# Patient Record
Sex: Female | Born: 1985 | Race: White | Hispanic: No | Marital: Single | Smoking: Never smoker
Health system: Southern US, Community
[De-identification: ages and names within clinical notes are randomized; demographics above are authoritative.]

## PROBLEM LIST (undated history)

## (undated) HISTORY — PX: CERVICAL BIOPSY  W/ LOOP ELECTRODE EXCISION: SUR135

---

## 2016-09-04 ENCOUNTER — Emergency Department (HOSPITAL_COMMUNITY)
Admission: EM | Admit: 2016-09-04 | Discharge: 2016-09-04 | Disposition: A | Discharge status: Home / Self Care | Payer: Self-pay | Attending: Emergency Medicine | Admitting: Emergency Medicine

## 2016-09-04 ENCOUNTER — Encounter (HOSPITAL_COMMUNITY): Payer: Self-pay | Admitting: Emergency Medicine

## 2016-09-04 DIAGNOSIS — J020 Streptococcal pharyngitis: Secondary | ICD-10-CM | POA: Insufficient documentation

## 2016-09-04 LAB — RAPID STREP SCREEN (MED CTR MEBANE ONLY): STREPTOCOCCUS, GROUP A SCREEN (DIRECT): POSITIVE — AB

## 2016-09-04 MED ORDER — CLINDAMYCIN HCL 150 MG PO CAPS: 150.0000 mg | ORAL_CAPSULE | Freq: Four times a day (QID) | ORAL | 0 refills | Status: DC

## 2016-09-04 MED ORDER — DEXAMETHASONE SODIUM PHOSPHATE 10 MG/ML IJ SOLN
10.0000 mg | Freq: Once | INTRAMUSCULAR | Status: AC
  Administered 2016-09-04: 10 mg via INTRAMUSCULAR
  Filled 2016-09-04: qty 1

## 2016-09-04 NOTE — ED Provider Notes (Signed)
[facility]-EMERGENCY DEPT Provider Note   CSN: [id number] Arrival date & time: 09/04/16  0222  History   Chief Complaint Chief Complaint  Patient presents with  . Sore Throat    HPI Carrie Wells is a 30 y.o. female.  HPI   Patient to the ER with complaints of sore throat that started 3 weeks ago. She has pain with swallowing, she has been taking garlic tablets for her pain. She is actually improving but her pain persists. She has not had fevers, headaches, n/v/d, no weakness or confusion. She is tolerating her secretions.  History reviewed. No pertinent past medical history.  There are no active problems to display for this patient.   History reviewed. No pertinent surgical history.  OB History    No data available       Home Medications    Prior to Admission medications   Medication Sig Start Date End Date Taking? Authorizing Provider  clindamycin (CLEOCIN) 150 MG capsule Take 1 capsule (150 mg total) by mouth every 6 (six) hours. 09/04/16   Marlon Peliffany Manjot Beumer, PA-C    Family History No family history on file.  Social History Social History  Substance Use Topics  . Smoking status: Never Smoker  . Smokeless tobacco: Never Used  . Alcohol use No     Allergies   Patient has no known allergies.   Review of Systems Review of Systems Review of Systems All other systems negative except as documented in the HPI. All pertinent positives and negatives as reviewed in the HPI.   Physical Exam Updated Vital Signs BP 129/70 (BP Location: Right Arm)   Pulse 97   Temp 98.2 F (36.8 C) (Oral)   Resp 16   Ht 5\' 1"  (1.549 m)   Wt 81.6 kg   SpO2 99%   BMI 34.01 kg/m   Physical Exam  Constitutional: She is oriented to person, place, and time. She appears well-developed and well-nourished. No distress.  HENT:  Head: Normocephalic and atraumatic.  Right Ear: Tympanic membrane, external ear and ear canal normal.  Left Ear: Tympanic membrane, external ear and ear  canal normal.  Nose: Nose normal. No rhinorrhea. Right sinus exhibits no maxillary sinus tenderness and no frontal sinus tenderness. Left sinus exhibits no maxillary sinus tenderness and no frontal sinus tenderness.  Mouth/Throat: Uvula is midline and mucous membranes are normal. No trismus in the jaw. Normal dentition. No dental abscesses or uvula swelling. Oropharyngeal exudate and posterior oropharyngeal edema present. No posterior oropharyngeal erythema or tonsillar abscesses.  No submental edema, tongue not elevated, no trismus. No impending airway obstruction; Pt able to speak full sentences, swallow intact, no drooling, stridor, or tonsillar/uvula displacement. No palatal petechia  Eyes: Conjunctivae are normal.  Neck: Trachea normal, normal range of motion and full passive range of motion without pain. Neck supple. No neck rigidity. Normal range of motion present. No Brudzinski's sign noted.  Flexion and extension of neck without pain or difficulty. Able to breath without difficulty in extension.  Cardiovascular: Normal rate and regular rhythm.   Pulmonary/Chest: Effort normal and breath sounds normal. No stridor. No respiratory distress. She has no wheezes.  Abdominal: Soft. There is no tenderness.  No obvious evidence of splenomegaly. Non ttp.   Musculoskeletal: Normal range of motion.  Lymphadenopathy:       Head (right side): No preauricular and no posterior auricular adenopathy present.       Head (left side): No preauricular and no posterior auricular adenopathy present.  She has cervical adenopathy.  Neurological: She is alert and oriented to person, place, and time.  Skin: Skin is warm and dry. No rash noted. She is not diaphoretic.  Psychiatric: She has a normal mood and affect.  Nursing note and vitals reviewed.    ED Treatments / Results  Labs (all labs ordered are listed, but only abnormal results are displayed) Labs Reviewed  RAPID STREP SCREEN (NOT AT [facility]) -  Abnormal; Notable for the following:       Result Value   Streptococcus, Group A Screen (Direct) POSITIVE (*)    All other components within normal limits    EKG  EKG Interpretation None       Radiology No results found.  Procedures Procedures (including critical care time)  Medications Ordered in ED Medications  dexamethasone (DECADRON) injection 10 mg (not administered)     Initial Impression / Assessment and Plan / ED Course  I have reviewed the triage vital signs and the nursing notes.  Pertinent labs & imaging results that were available during my care of the patient were reviewed by me and considered in my medical decision making (see chart for details).  Clinical Course     Pt rapid strep test positive. Pt is tolerating secretions. Presentation not concerning for peritonsillar abscess or spread of infection to deep spaces of the throat; patent airway. Pt will be discharged with clindamycin.  Specific return precautions discussed. Recommended PCP follow up.  Specific return precautions discussed.  Recommended PCP follow up. Pt appears safe for discharge.      Final Clinical Impressions(s) / ED Diagnoses   Final diagnoses:  Strep pharyngitis    New Prescriptions New Prescriptions   CLINDAMYCIN (CLEOCIN) 150 MG CAPSULE    Take 1 capsule (150 mg total) by mouth every 6 (six) hours.     Marlon Peliffany Unika Nazareno, PA-C 09/04/16 0505    Gilda Creasehristopher J Pollina, MD 09/04/16 [phone]

## 2016-09-04 NOTE — ED Triage Notes (Signed)
Pt. reports sore throat for 3 weeks , denies fever or chills , respirations unlabored .

## 2017-07-30 ENCOUNTER — Emergency Department (HOSPITAL_COMMUNITY)
Admission: EM | Admit: 2017-07-30 | Discharge: 2017-07-31 | Disposition: A | Discharge status: Home / Self Care | Payer: Self-pay | Attending: Emergency Medicine | Admitting: Emergency Medicine

## 2017-07-30 ENCOUNTER — Emergency Department (HOSPITAL_COMMUNITY): Payer: Self-pay

## 2017-07-30 ENCOUNTER — Other Ambulatory Visit: Payer: Self-pay

## 2017-07-30 ENCOUNTER — Encounter (HOSPITAL_COMMUNITY): Payer: Self-pay | Admitting: Emergency Medicine

## 2017-07-30 DIAGNOSIS — J039 Acute tonsillitis, unspecified: Secondary | ICD-10-CM | POA: Insufficient documentation

## 2017-07-30 DIAGNOSIS — J0101 Acute recurrent maxillary sinusitis: Secondary | ICD-10-CM | POA: Insufficient documentation

## 2017-07-30 DIAGNOSIS — Z79899 Other long term (current) drug therapy: Secondary | ICD-10-CM | POA: Insufficient documentation

## 2017-07-30 LAB — CBC WITH DIFFERENTIAL/PLATELET
Basophils Absolute: 0 10*3/uL (ref 0.0–0.1)
Basophils Relative: 0 %
Eosinophils Absolute: 0.1 10*3/uL (ref 0.0–0.7)
Eosinophils Relative: 1 %
HCT: 41.5 % (ref 36.0–46.0)
Hemoglobin: 14.1 g/dL (ref 12.0–15.0)
Lymphocytes Relative: 24 %
Lymphs Abs: 2.5 10*3/uL (ref 0.7–4.0)
MCH: 32.1 pg (ref 26.0–34.0)
MCHC: 34 g/dL (ref 30.0–36.0)
MCV: 94.5 fL (ref 78.0–100.0)
Monocytes Absolute: 0.6 10*3/uL (ref 0.1–1.0)
Monocytes Relative: 6 %
Neutro Abs: 7.1 10*3/uL (ref 1.7–7.7)
Neutrophils Relative %: 69 %
Platelets: 263 10*3/uL (ref 150–400)
RBC: 4.39 MIL/uL (ref 3.87–5.11)
RDW: 13 % (ref 11.5–15.5)
WBC: 10.2 10*3/uL (ref 4.0–10.5)

## 2017-07-30 LAB — COMPREHENSIVE METABOLIC PANEL
ALT: 15 U/L (ref 14–54)
AST: 19 U/L (ref 15–41)
Albumin: 4.3 g/dL (ref 3.5–5.0)
Alkaline Phosphatase: 85 U/L (ref 38–126)
Anion gap: 9 (ref 5–15)
BILIRUBIN TOTAL: 1.1 mg/dL (ref 0.3–1.2)
BUN: 8 mg/dL (ref 6–20)
CALCIUM: 9.1 mg/dL (ref 8.9–10.3)
CO2: 23 mmol/L (ref 22–32)
CREATININE: 0.83 mg/dL (ref 0.44–1.00)
Chloride: 106 mmol/L (ref 101–111)
GFR calc Af Amer: >60 mL/min (ref >60)
Glucose, Bld: 89 mg/dL (ref 65–99)
Potassium: 3.4 mmol/L — ABNORMAL LOW (ref 3.5–5.1)
Sodium: 138 mmol/L (ref 135–145)
TOTAL PROTEIN: 7.7 g/dL (ref 6.5–8.1)

## 2017-07-30 LAB — I-STAT CG4 LACTIC ACID, ED: Lactic Acid, Venous: 1.16 mmol/L (ref 0.5–1.9)

## 2017-07-30 NOTE — ED Triage Notes (Signed)
Patient arrives with complaint of sore throat, swollen tonsils, shortness of breath, cough, ear pain, body aches, nausea, and malaise. States onset as beginning of November. States it has gotten better at times, but keeps returning. States subjective fevers at home.

## 2017-07-31 MED ORDER — CLINDAMYCIN HCL 300 MG PO CAPS: 300.0000 mg | ORAL_CAPSULE | Freq: Four times a day (QID) | ORAL | 0 refills | Status: DC

## 2017-07-31 MED ORDER — PREDNISONE 10 MG PO TABS: 20.0000 mg | ORAL_TABLET | Freq: Two times a day (BID) | ORAL | 0 refills | Status: AC

## 2017-07-31 NOTE — ED Provider Notes (Signed)
[facility] EMERGENCY [facility] Provider Note   CSN: [id number] Arrival date & time: 07/30/17  2146     History   Chief Complaint Chief Complaint  Patient presents with  . Shortness of Breath  . Sore Throat  . Generalized Body Aches    HPI Carrie Wells is a 31 y.o. female.  Patient is a 31 year old female with no significant past medical history presenting for evaluation of a 3-week history of sore throat, ear pressure, sinus pressure, and dry cough.  She has tried multiple over-the-counter medications, however is not improving.  She denies any ill contacts.  She does report fevers at home.   The history is provided by the patient.  Sore Throat  This is a new problem. Episode onset: 3 weeks ago. The problem occurs constantly. The problem has been rapidly worsening. Associated symptoms include shortness of breath. Pertinent negatives include no chest pain. Nothing aggravates the symptoms. Nothing relieves the symptoms. She has tried acetaminophen for the symptoms. The treatment provided no relief.    History reviewed. No pertinent past medical history.  There are no active problems to display for this patient.   Past Surgical History:  Procedure Laterality Date  . CERVICAL BIOPSY  W/ LOOP ELECTRODE EXCISION    . CESAREAN SECTION      OB History    No data available       Home Medications    Prior to Admission medications   Medication Sig Start Date End Date Taking? Authorizing Provider  clindamycin (CLEOCIN) 150 MG capsule Take 1 capsule (150 mg total) by mouth every 6 (six) hours. 09/04/16   Marlon PelGreene, Tiffany, PA-C    Family History History reviewed. No pertinent family history.  Social History Social History   Tobacco Use  . Smoking status: Never Smoker  . Smokeless tobacco: Never Used  Substance Use Topics  . Alcohol use: No  . Drug use: No     Allergies   Penicillins   Review of Systems Review of Systems    Respiratory: Positive for shortness of breath.   Cardiovascular: Negative for chest pain.  All other systems reviewed and are negative.    Physical Exam Updated Vital Signs BP (!) 144/90   Pulse 73   Temp 98.5 F (36.9 C) (Oral)   Resp 18   Ht 5\' 1"  (1.549 m)   Wt 79.4 kg (175 lb)   LMP  (Within Weeks)   SpO2 99%   BMI 33.07 kg/m   Physical Exam  Constitutional: She is oriented to person, place, and time. She appears well-developed and well-nourished. No distress.  HENT:  Head: Normocephalic and atraumatic.  Mouth/Throat: No oropharyngeal exudate or posterior oropharyngeal edema.  There is mild inflammation of the posterior oropharynx.  TMs are clear bilaterally.  There is tenderness over the maxillary and frontal sinuses.  Neck: Normal range of motion. Neck supple.  Cardiovascular: Normal rate and regular rhythm. Exam reveals no gallop and no friction rub.  No murmur heard. Pulmonary/Chest: Effort normal and breath sounds normal. No respiratory distress. She has no wheezes.  Abdominal: Soft. Bowel sounds are normal. She exhibits no distension. There is no tenderness.  Musculoskeletal: Normal range of motion.  Neurological: She is alert and oriented to person, place, and time.  Skin: Skin is warm and dry. She is not diaphoretic.  Nursing note and vitals reviewed.    ED Treatments / Results  Labs (all labs ordered are listed, but only abnormal results are displayed)  Labs Reviewed  COMPREHENSIVE METABOLIC PANEL - Abnormal; Notable for the following components:      Result Value   Potassium 3.4 (*)    All other components within normal limits  CBC WITH DIFFERENTIAL/PLATELET  I-STAT CG4 LACTIC ACID, ED  I-STAT CG4 LACTIC ACID, ED    EKG  EKG Interpretation None       Radiology Dg Chest 2 View  Result Date: 07/30/2017 CLINICAL DATA:  Sore throat, tonsillar swelling, shortness of breath, cough, body aches, nausea, and bullae since November. Fevers. EXAM:  CHEST  2 VIEW COMPARISON:  None. FINDINGS: The heart size and mediastinal contours are within normal limits. Both lungs are clear. The visualized skeletal structures are unremarkable. IMPRESSION: No active cardiopulmonary disease. Electronically Signed   By: Burman NievesWilliam  Stevens M.D.   On: 07/30/2017 22:36    Procedures Procedures (including critical care time)  Medications Ordered in ED Medications - No data to display   Initial Impression / Assessment and Plan / ED Course  I have reviewed the triage vital signs and the nursing notes.  Pertinent labs & imaging results that were available during my care of the patient were reviewed by me and considered in my medical decision making (see chart for details).  Patient with URI symptoms for greater than 3 weeks.  She has tenderness over her sinuses and reports getting "tonsillitis" at least once per year.  In the past she has been on antibiotics which helps her symptoms.  She will be given amoxicillin, prednisone, and follow-up as needed.  Final Clinical Impressions(s) / ED Diagnoses   Final diagnoses:  None    ED Discharge Orders    None       Geoffery Lyonselo, Mariell Nester, MD 07/31/17 [phone]

## 2017-07-31 NOTE — Discharge Instructions (Signed)
Clindamycin and prednisone as prescribed.  Ibuprofen 600 mg's every 6 hours as needed for pain.  Follow up with your primary doctor if not feeling better in the next 3 days.

## 2018-06-14 ENCOUNTER — Emergency Department (HOSPITAL_COMMUNITY): Payer: Self-pay

## 2018-06-14 ENCOUNTER — Encounter (HOSPITAL_COMMUNITY): Payer: Self-pay

## 2018-06-14 ENCOUNTER — Other Ambulatory Visit: Payer: Self-pay

## 2018-06-14 ENCOUNTER — Emergency Department (HOSPITAL_COMMUNITY)
Admission: EM | Admit: 2018-06-14 | Discharge: 2018-06-14 | Disposition: A | Discharge status: Home / Self Care | Payer: Self-pay | Attending: Emergency Medicine | Admitting: Emergency Medicine

## 2018-06-14 DIAGNOSIS — I4589 Other specified conduction disorders: Secondary | ICD-10-CM | POA: Insufficient documentation

## 2018-06-14 DIAGNOSIS — R9431 Abnormal electrocardiogram [ECG] [EKG]: Secondary | ICD-10-CM

## 2018-06-14 DIAGNOSIS — J069 Acute upper respiratory infection, unspecified: Secondary | ICD-10-CM | POA: Insufficient documentation

## 2018-06-14 DIAGNOSIS — B9789 Other viral agents as the cause of diseases classified elsewhere: Secondary | ICD-10-CM

## 2018-06-14 DIAGNOSIS — R202 Paresthesia of skin: Secondary | ICD-10-CM | POA: Insufficient documentation

## 2018-06-14 DIAGNOSIS — Z79899 Other long term (current) drug therapy: Secondary | ICD-10-CM | POA: Insufficient documentation

## 2018-06-14 NOTE — ED Provider Notes (Signed)
[facility] EMERGENCY DEPARTMENT Provider Note   CSN: [id number] Arrival date & time: 06/14/18  1351   History   Chief Complaint Chief Complaint  Patient presents with  . Cough    HPI Carrie Wells is a 32 y.o. female.  HPI   32 year old female presents today with complaints of upper respiratory infection.  Patient notes 3 days ago she developed fevers, chills, cough and sore throat.  She notes that the symptoms have persisted, she notes taking Mucinex and DayQuil with no improvement in her symptoms.  She notes yesterday at work she had increased respirations, tachycardia, tingling in her fingers.  Patient notes associated chest tightness and shortness of breath.  Patient notes symptoms have improved.  No history DVT or PE, denies any significant risk factors.  Patient reports she vapes, does not smoke cigarettes.   History reviewed. No pertinent past medical history.  There are no active problems to display for this patient.   Past Surgical History:  Procedure Laterality Date  . CERVICAL BIOPSY  W/ LOOP ELECTRODE EXCISION    . CESAREAN SECTION       OB History   None      Home Medications    Prior to Admission medications   Medication Sig Start Date End Date Taking? Authorizing Provider  clindamycin (CLEOCIN) 300 MG capsule Take 1 capsule (300 mg total) by mouth 4 (four) times daily. X 7 days 07/31/17   Geoffery Lyons, MD  predniSONE (DELTASONE) 10 MG tablet Take 2 tablets (20 mg total) by mouth 2 (two) times daily with a meal. 07/31/17   Geoffery Lyons, MD    Family History No family history on file.  Social History Social History   Tobacco Use  . Smoking status: Never Smoker  . Smokeless tobacco: Never Used  Substance Use Topics  . Alcohol use: No  . Drug use: No     Allergies   Penicillins   Review of Systems Review of Systems  All other systems reviewed and are negative.   Physical Exam Updated Vital Signs BP 114/69  (BP Location: Right Arm)   Pulse (!) 55   Temp 98.6 F (37 C) (Oral)   Resp 16   SpO2 99%   Physical Exam  Constitutional: She is oriented to person, place, and time. She appears well-developed and well-nourished.  HENT:  Head: Normocephalic and atraumatic.  Eyes: Pupils are equal, round, and reactive to light. Conjunctivae are normal. Right eye exhibits no discharge. Left eye exhibits no discharge. No scleral icterus.  Neck: Normal range of motion. No JVD present. No tracheal deviation present.  Cardiovascular: Normal rate, regular rhythm, normal heart sounds and intact distal pulses. Exam reveals no gallop and no friction rub.  No murmur heard. Pulmonary/Chest: Effort normal and breath sounds normal. No stridor. No respiratory distress. She has no wheezes. She has no rales. She exhibits no tenderness.  Musculoskeletal: She exhibits no edema.  Neurological: She is alert and oriented to person, place, and time. Coordination normal.  Psychiatric: She has a normal mood and affect. Her behavior is normal. Judgment and thought content normal.  Nursing note and vitals reviewed.    ED Treatments / Results  Labs (all labs ordered are listed, but only abnormal results are displayed) Labs Reviewed  I-STAT CHEM 8, ED    EKG None   Normal sinus rhythm 79 bpm  Vent. rate 79 BPM PR interval 146 ms QRS duration 76 ms QT/QTc 424/486 ms P-R-T axes 71  85 72  Prolonged QT noted  Radiology Dg Chest 2 View  Result Date: 06/14/2018 CLINICAL DATA:  Central and left-sided chest pain with dyspnea x3 days. EXAM: CHEST - 2 VIEW COMPARISON:  07/30/2017 FINDINGS: The heart size and mediastinal contours are within normal limits. Both lungs are clear. The visualized skeletal structures are unremarkable. IMPRESSION: No active cardiopulmonary disease. Electronically Signed   By: Tollie Eth M.D.   On: 06/14/2018 14:33    Procedures Procedures (including critical care time)  Medications Ordered in  ED Medications - No data to display   Initial Impression / Assessment and Plan / ED Course  I have reviewed the triage vital signs and the nursing notes.  Pertinent labs & imaging results that were available during my care of the patient were reviewed by me and considered in my medical decision making (see chart for details).     Labs:   Imaging: ED EKG  Consults:  Therapeutics:  Discharge Meds:   Assessment/Plan: 32 year old female presents today with likely viral URI.  Patient also having paresthesias, none presently, these are likely secondary to anxiety.  I did recommend laboratory analysis, patient would not like any further testing or evaluation at this time.  Patient had an EKG with slightly prolonged QT; patient has not had any EKGs previously, she does not take any medications presently.  I have encouraged her to follow-up with cardiology for repeat evaluation.  She is given strict return precautions, she verbalized understanding and agreement to today's plan.     Final Clinical Impressions(s) / ED Diagnoses   Final diagnoses:  Viral URI with cough  Paresthesia  Prolonged QT interval    ED Discharge Orders    None       Eyvonne Mechanic, PA-C 06/14/18 1658    Doug Sou, MD 06/14/18 2259

## 2018-06-14 NOTE — Discharge Instructions (Addendum)
Please read attached information. If you experience any new or worsening signs or symptoms please return to the emergency room for evaluation. Please follow-up with your primary care provider or specialist as discussed.  °

## 2018-06-14 NOTE — ED Triage Notes (Signed)
Patient complains of cough and congestion since Sunday. States that the pain is worse with inspiration and movement. NAD

## 2018-06-14 NOTE — ED Notes (Signed)
Pt states that she does not want blood work done and that she wants to leave; EDP at bedside

## 2018-06-14 NOTE — ED Notes (Signed)
Patient verbalizes understanding of discharge instructions. Opportunity for questioning and answers were provided. Pt discharged from ED. 

## 2018-06-19 IMAGING — CR DG CHEST 2V
2 series · 2 of 2 positions shown · non-contrast
Comparison: None.

CLINICAL DATA: Sore throat, tonsillar swelling, shortness of
breath, cough, body aches, nausea, and bullae since [REDACTED].
Fevers.

EXAM:
CHEST  2 VIEW

[chest pa]
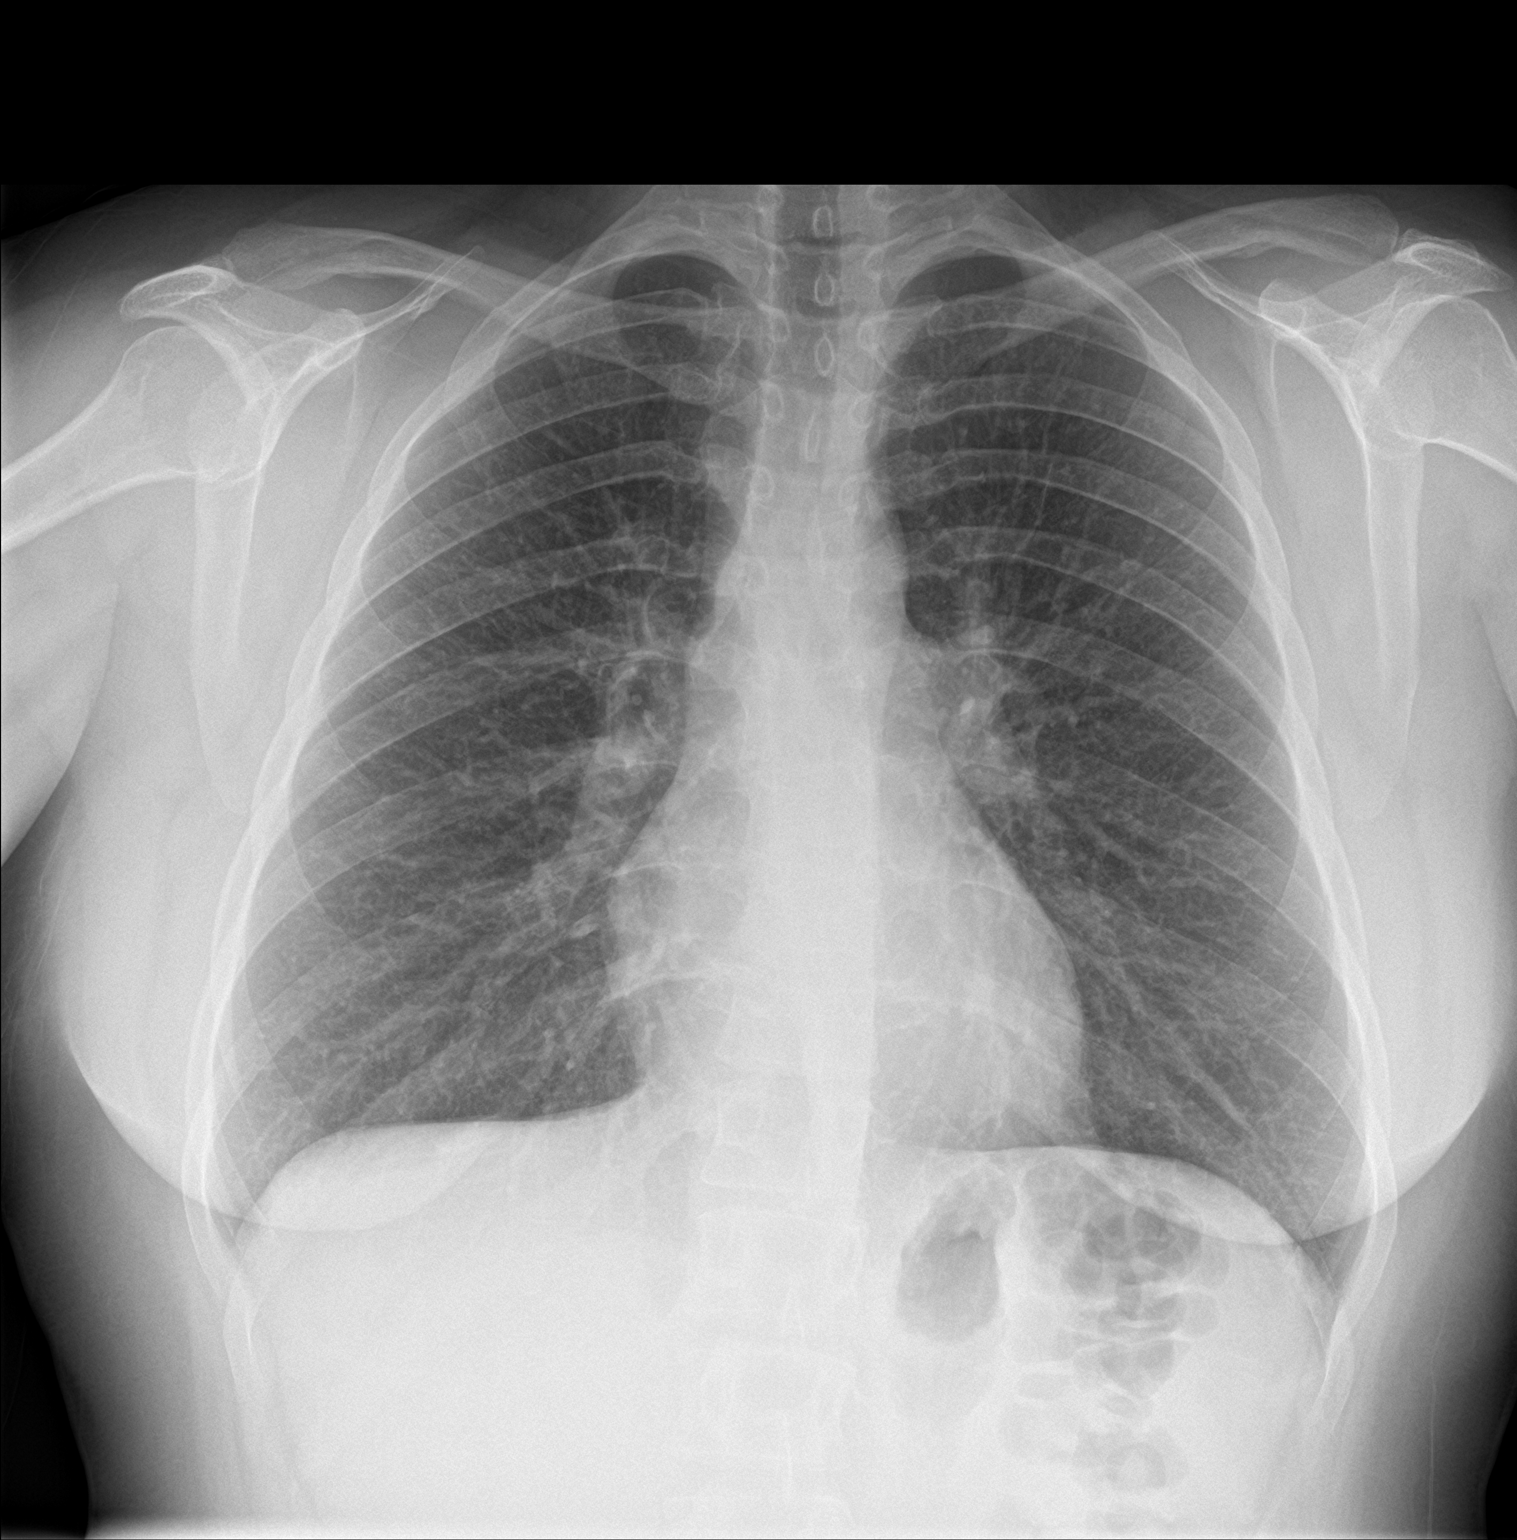

[chest lat]
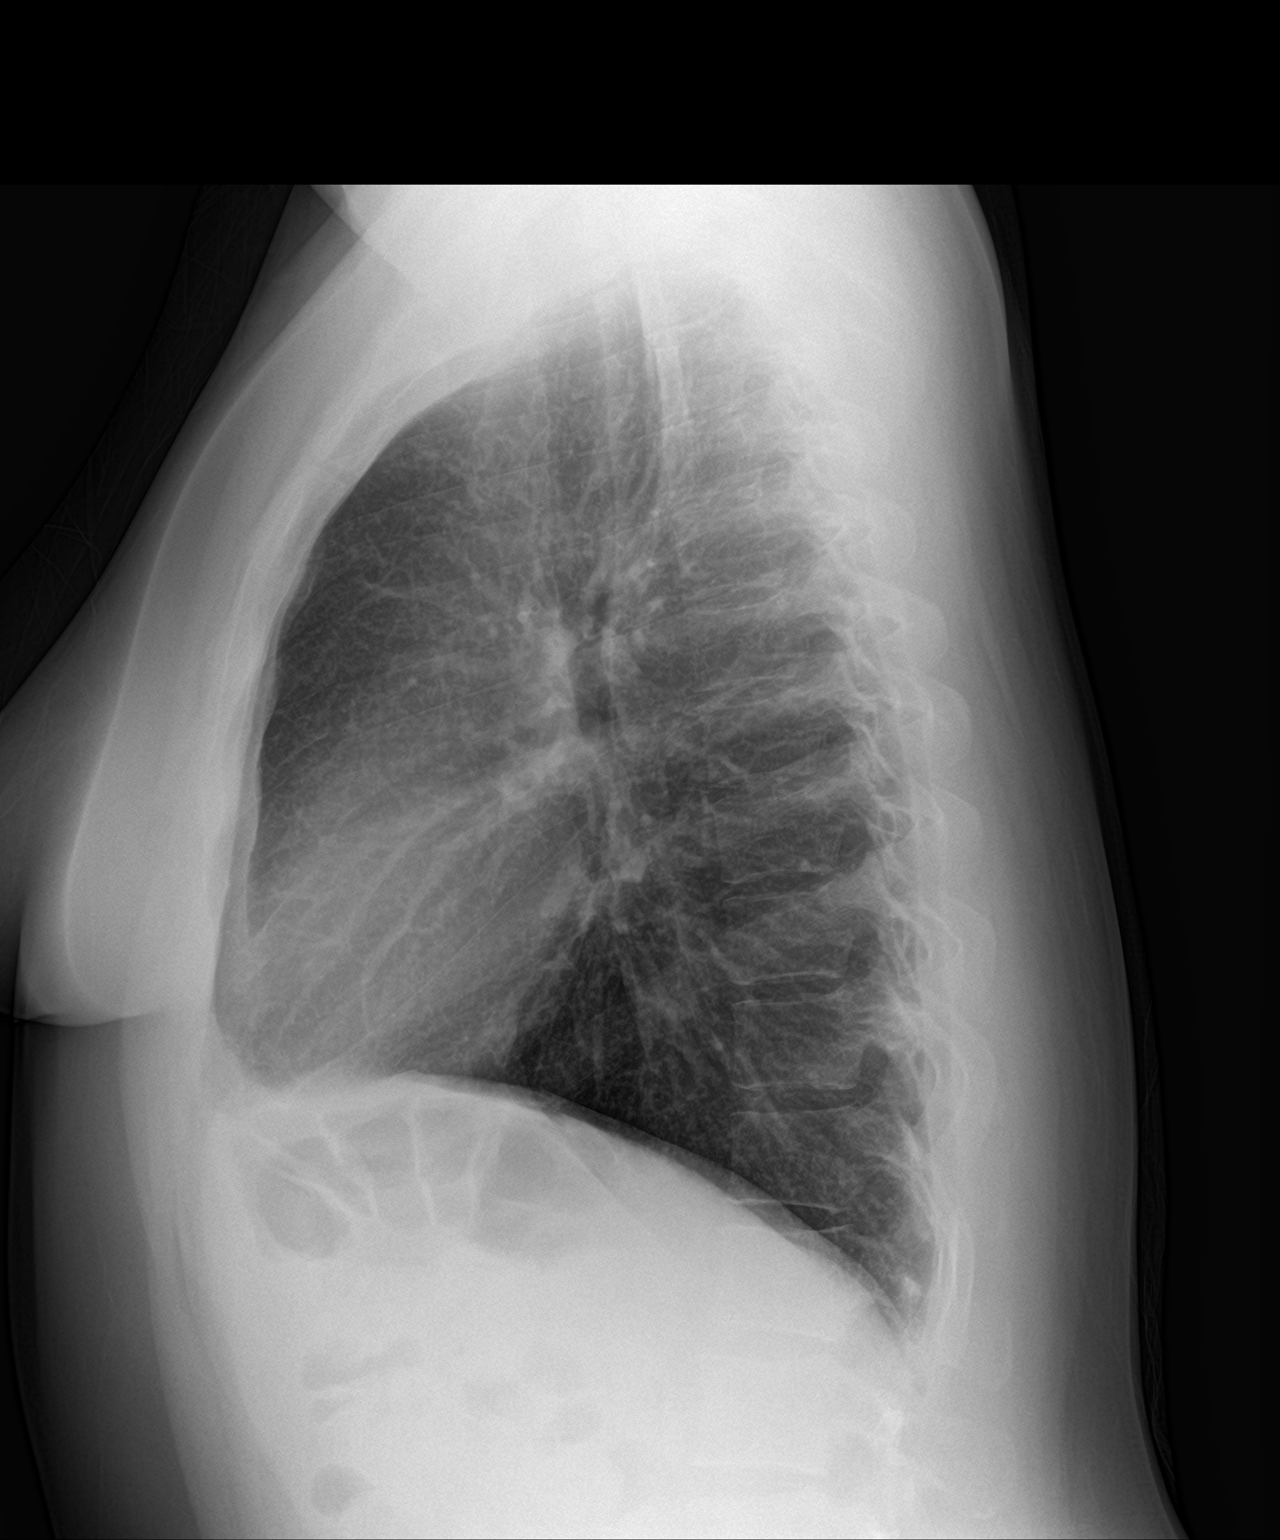

[2 of 2 positions shown; findings below may reference images not displayed]

FINDINGS: The heart size and mediastinal contours are within normal limits.
Both lungs are clear. The visualized skeletal structures are
unremarkable.
IMPRESSION: No active cardiopulmonary disease.

## 2018-07-12 ENCOUNTER — Emergency Department (HOSPITAL_COMMUNITY)
Admission: EM | Admit: 2018-07-12 | Discharge: 2018-07-12 | Disposition: A | Discharge status: Home / Self Care | Payer: Self-pay | Attending: Emergency Medicine | Admitting: Emergency Medicine

## 2018-07-12 ENCOUNTER — Encounter (HOSPITAL_COMMUNITY): Payer: Self-pay | Admitting: Emergency Medicine

## 2018-07-12 ENCOUNTER — Other Ambulatory Visit: Payer: Self-pay

## 2018-07-12 DIAGNOSIS — K029 Dental caries, unspecified: Secondary | ICD-10-CM | POA: Insufficient documentation

## 2018-07-12 MED ORDER — CLINDAMYCIN HCL 150 MG PO CAPS
300.0000 mg | ORAL_CAPSULE | Freq: Once | ORAL | Status: AC
Start: 2018-07-12 — End: 2018-07-12
  Administered 2018-07-12: 300 mg via ORAL
  Filled 2018-07-12: qty 2

## 2018-07-12 MED ORDER — OXYCODONE-ACETAMINOPHEN 5-325 MG PO TABS
1.0000 | ORAL_TABLET | Freq: Once | ORAL | Status: AC
  Administered 2018-07-12: 1 via ORAL
  Filled 2018-07-12: qty 1

## 2018-07-12 MED ORDER — CLINDAMYCIN HCL 300 MG PO CAPS: 300.0000 mg | ORAL_CAPSULE | Freq: Four times a day (QID) | ORAL | 0 refills | Status: AC

## 2018-07-12 NOTE — ED Triage Notes (Signed)
Pt to ED with c/o severe toothache.  Pt st's has appt tomorrow to have tooth pulled st's can't wail until then due to pain.  Pt tearful in triage

## 2018-07-12 NOTE — Discharge Instructions (Addendum)
Follow up with your dentist tomorrow as planned. Use 2 Advil Liquid Gels with 500mg  Tylenol every 6 hours as needed for pain. Apply numbing gel as needed. Rinse with Listerine after every meal.

## 2018-07-12 NOTE — ED Notes (Signed)
Pt stable, ambulatory, states understanding of discharge instructions 

## 2018-07-12 NOTE — ED Provider Notes (Signed)
[facility] EMERGENCY DEPARTMENT Provider Note   CSN: [id number] Arrival date & time: 07/12/18  1930     History   Chief Complaint Chief Complaint  Patient presents with  . Dental Pain    HPI Carrie Wells is a 32 y.o. female.  32 year old female presents with complaint of upper and lower dental pain for the past few days. Patient states she has bad teeth due to an eating disorder as a child and then following childbirth. States she is scheduled to have the teeth pulled tomorrow but the pain is severe and not controlled with Motrin 800mg  and APAP at home. Denies trauma, fever, drainage. No other complaints or concerns.      History reviewed. No pertinent past medical history.  There are no active problems to display for this patient.   Past Surgical History:  Procedure Laterality Date  . CERVICAL BIOPSY  W/ LOOP ELECTRODE EXCISION    . CESAREAN SECTION       OB History   None      Home Medications    Prior to Admission medications   Medication Sig Start Date End Date Taking? Authorizing Provider  clindamycin (CLEOCIN) 300 MG capsule Take 1 capsule (300 mg total) by mouth every 6 (six) hours for 7 days. 07/12/18 07/19/18  Jeannie Fend, PA-C  predniSONE (DELTASONE) 10 MG tablet Take 2 tablets (20 mg total) by mouth 2 (two) times daily with a meal. 07/31/17   Geoffery Lyons, MD    Family History No family history on file.  Social History Social History   Tobacco Use  . Smoking status: Never Smoker  . Smokeless tobacco: Never Used  Substance Use Topics  . Alcohol use: No  . Drug use: No     Allergies   Penicillins   Review of Systems Review of Systems  Constitutional: Negative for chills and fever.  HENT: Positive for dental problem and ear pain. Negative for ear discharge, facial swelling, trouble swallowing and voice change.   Gastrointestinal: Negative for vomiting.  Musculoskeletal: Negative for neck pain and neck  stiffness.  Skin: Negative for rash and wound.  Allergic/Immunologic: Negative for immunocompromised state.  Neurological: Negative for headaches.  Hematological: Negative for adenopathy.  Psychiatric/Behavioral: Negative for confusion.  All other systems reviewed and are negative.    Physical Exam Updated Vital Signs BP (!) 149/92 (BP Location: Right Arm)   Pulse (!) 103   Temp 98.1 F (36.7 C) (Oral)   Resp 20   Ht 5\' 1"  (1.549 m)   Wt 83.9 kg   SpO2 100%   BMI 34.96 kg/m   Physical Exam  Constitutional: She is oriented to person, place, and time. She appears well-developed and well-nourished. No distress.  HENT:  Head: Normocephalic and atraumatic.  Right Ear: Tympanic membrane and external ear normal.  Left Ear: Tympanic membrane and external ear normal.  Mouth/Throat: Uvula is midline and oropharynx is clear and moist. No trismus in the jaw. Abnormal dentition. Dental caries present. No dental abscesses or uvula swelling. No oropharyngeal exudate.    Eyes: Conjunctivae are normal.  Neck: Neck supple.  Cardiovascular: Intact distal pulses.  Pulmonary/Chest: Effort normal.  Lymphadenopathy:    She has no cervical adenopathy.  Neurological: She is alert and oriented to person, place, and time.  Skin: Skin is warm and dry. No rash noted. She is not diaphoretic. No erythema.  Psychiatric: She has a normal mood and affect. Her behavior is normal.  Nursing note  and vitals reviewed.    ED Treatments / Results  Labs (all labs ordered are listed, but only abnormal results are displayed) Labs Reviewed - No data to display  EKG None  Radiology No results found.  Procedures Procedures (including critical care time)  Medications Ordered in ED Medications  clindamycin (CLEOCIN) capsule 300 mg (has no administration in time range)  oxyCODONE-acetaminophen (PERCOCET/ROXICET) 5-325 MG per tablet 1 tablet (1 tablet Oral Given 07/12/18 1945)     Initial Impression /  Assessment and Plan / ED Course  I have reviewed the triage vital signs and the nursing notes.  Pertinent labs & imaging results that were available during my care of the patient were reviewed by me and considered in my medical decision making (see chart for details).  Clinical Course as of Jul 12 2042  Tue Jul 12, 2018  2042 31 yo female with dental pain, scheduled for extraction tomorrow, concerned her pain may be due to developing an infection. Patient was given Percocet in Triage, states pain is slightly improved. Accepts topical anesthetic in the ER as well, declines dental block. Given 1st dose of Clindamycin with rx for same. Patient agreeable with plan of care.     [LM]    Clinical Course User Index [LM] Jeannie Fend, PA-C   Final Clinical Impressions(s) / ED Diagnoses   Final diagnoses:  Pain due to dental caries    ED Discharge Orders         Ordered    clindamycin (CLEOCIN) 300 MG capsule  Every 6 hours     07/12/18 2034           Jeannie Fend, PA-C 07/12/18 2043    Derwood Kaplan, MD 07/13/18 1309

## 2019-05-04 IMAGING — DX DG CHEST 2V
2 series · 2 of 2 positions shown · non-contrast
Comparison: 07/30/2017

CLINICAL DATA: Central and left-sided chest pain with dyspnea x3
days.

EXAM:
CHEST - 2 VIEW

[w chest pa]
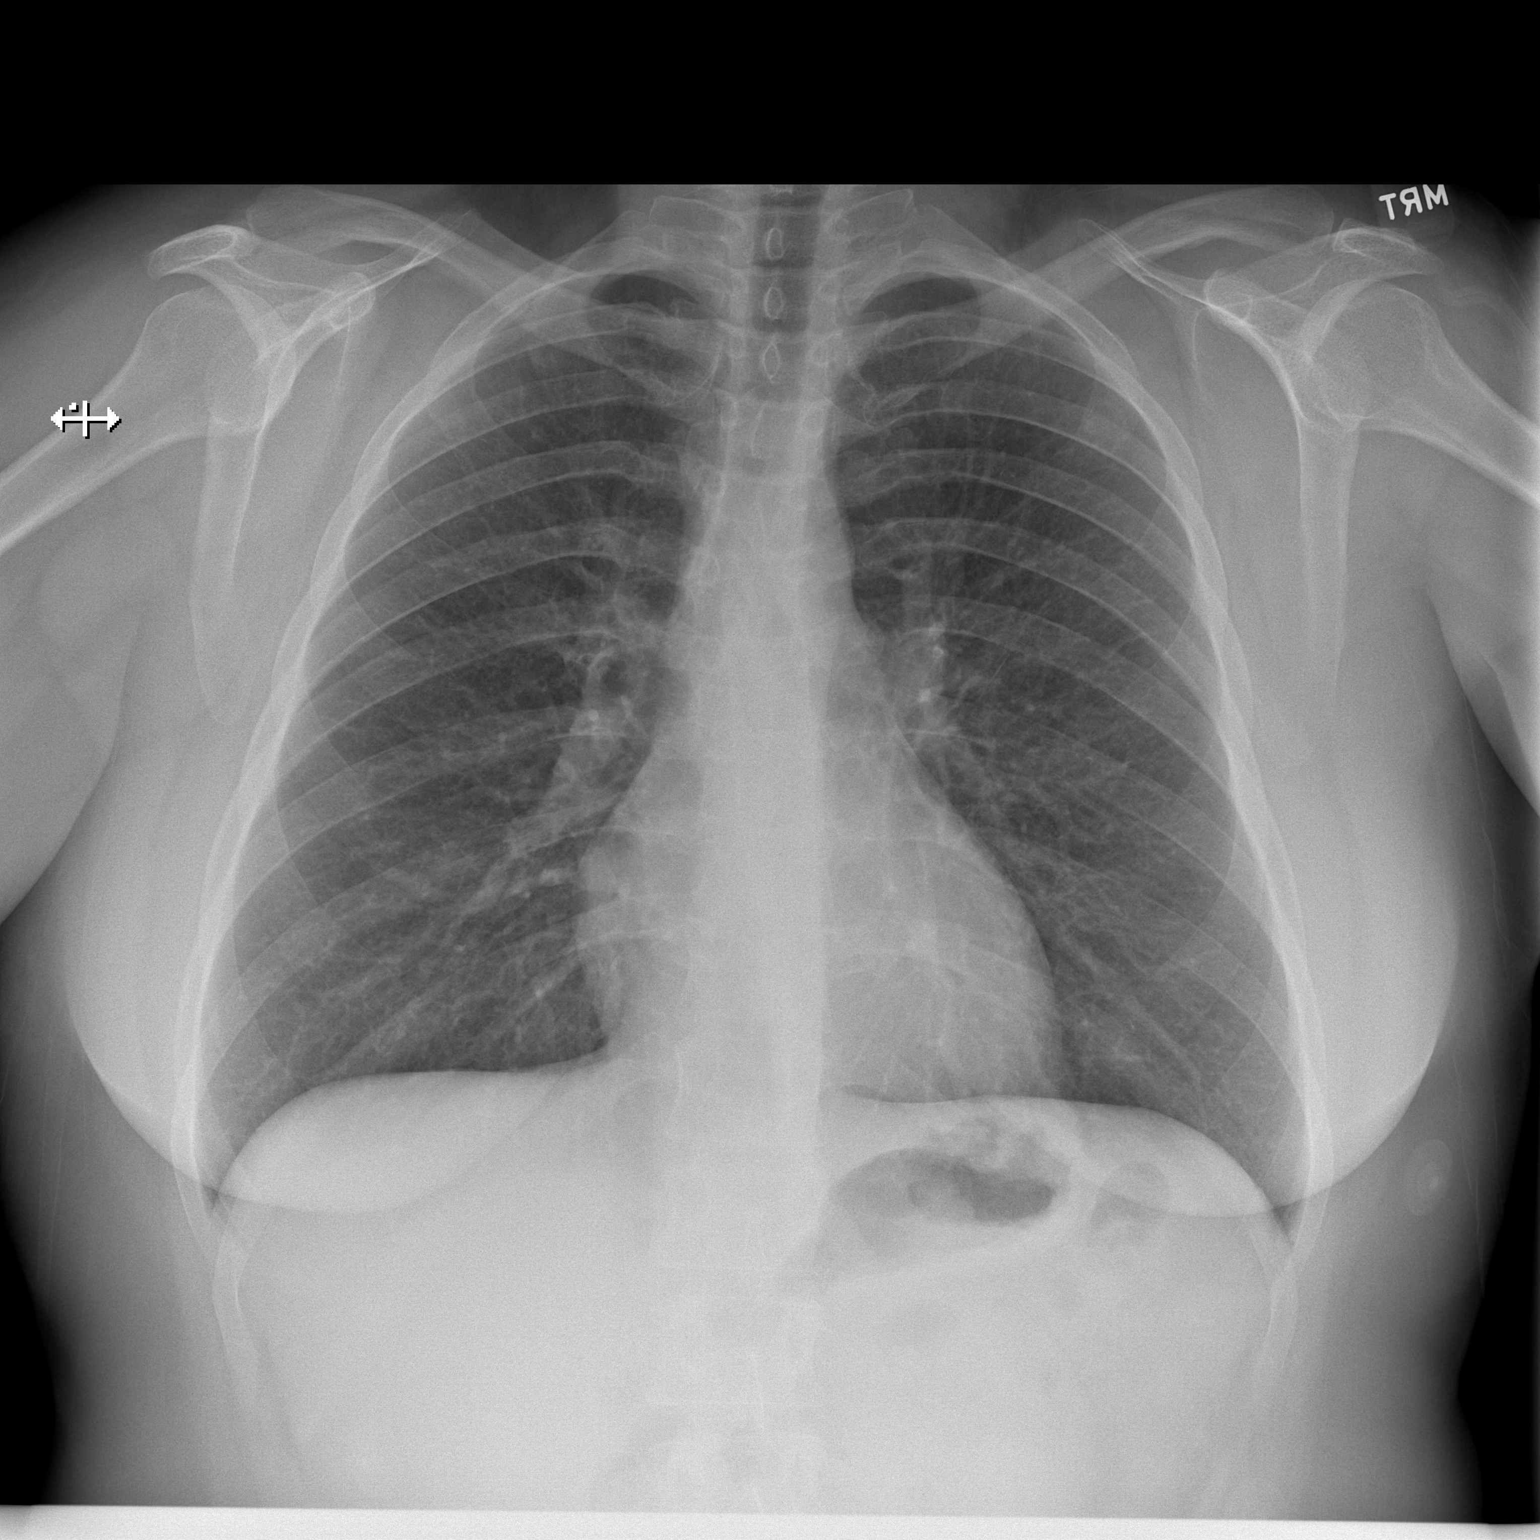

[w chest lat]
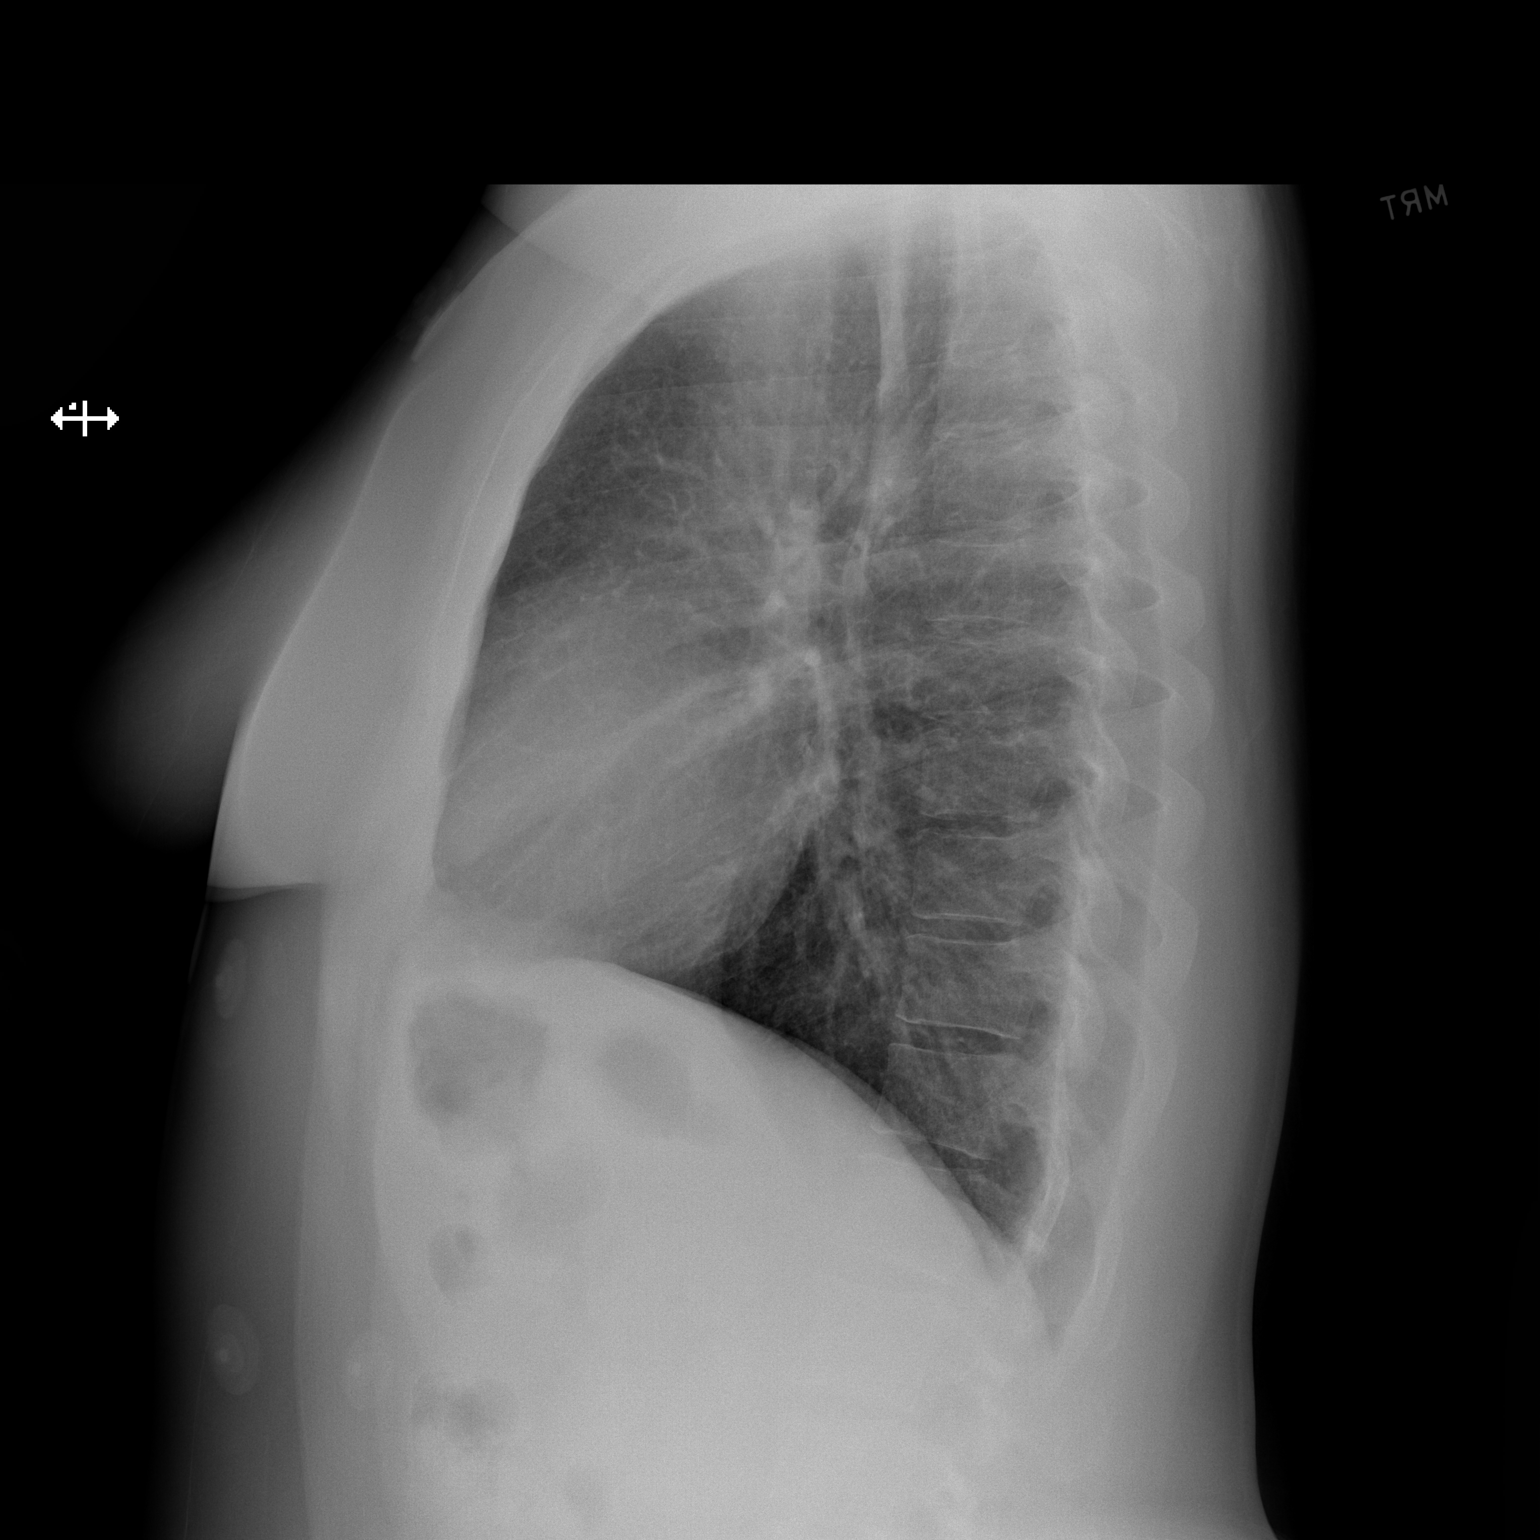

[2 of 2 positions shown; findings below may reference images not displayed]

FINDINGS: The heart size and mediastinal contours are within normal limits.
Both lungs are clear. The visualized skeletal structures are
unremarkable.
IMPRESSION: No active cardiopulmonary disease.

## 2022-11-05 ENCOUNTER — Emergency Department (HOSPITAL_COMMUNITY): Payer: BC Managed Care – PPO

## 2022-11-05 ENCOUNTER — Emergency Department (HOSPITAL_COMMUNITY)
Admission: EM | Admit: 2022-11-05 | Discharge: 2022-11-05 | Disposition: A | Discharge status: Home / Self Care | Payer: BC Managed Care – PPO | Attending: Emergency Medicine | Admitting: Emergency Medicine

## 2022-11-05 ENCOUNTER — Encounter (HOSPITAL_COMMUNITY): Payer: Self-pay

## 2022-11-05 ENCOUNTER — Other Ambulatory Visit: Payer: Self-pay

## 2022-11-05 DIAGNOSIS — S93602A Unspecified sprain of left foot, initial encounter: Secondary | ICD-10-CM | POA: Insufficient documentation

## 2022-11-05 DIAGNOSIS — S99922A Unspecified injury of left foot, initial encounter: Secondary | ICD-10-CM | POA: Diagnosis not present

## 2022-11-05 DIAGNOSIS — S93692A Other sprain of left foot, initial encounter: Secondary | ICD-10-CM | POA: Diagnosis not present

## 2022-11-05 DIAGNOSIS — Y9301 Activity, walking, marching and hiking: Secondary | ICD-10-CM | POA: Insufficient documentation

## 2022-11-05 DIAGNOSIS — W502XXA Accidental twist by another person, initial encounter: Secondary | ICD-10-CM | POA: Diagnosis not present

## 2022-11-05 DIAGNOSIS — Z0389 Encounter for observation for other suspected diseases and conditions ruled out: Secondary | ICD-10-CM | POA: Diagnosis not present

## 2022-11-05 NOTE — ED Triage Notes (Signed)
Patient said she jumped down from a counter and hurt her left ankle. Swelling to her ankle. No change in sensation to any toes except her pinky toe feels a little numb.

## 2022-11-05 NOTE — Discharge Instructions (Addendum)
Your xray is normal. Ice, elevation, ibuprofen and tylenol will be good for your pain.

## 2022-11-05 NOTE — ED Provider Notes (Signed)
[facility] Provider Note   CSN: [id number] Arrival date & time: 11/05/22  1257     History  Chief Complaint  Patient presents with   Ankle Pain    Carrie Wells is a 37 y.o. female presenting today with a left foot injury.  She reports that she was walking and slid with soft socks, twisting her left ankle.  Says that she has had previous injuries but always ends up with imbalance.  Tried ibuprofen that did not help her.  No numbness or tingling.  Pain with weightbearing   Ankle Pain      Home Medications Prior to Admission medications   Medication Sig Start Date End Date Taking? Authorizing Provider  predniSONE (DELTASONE) 10 MG tablet Take 2 tablets (20 mg total) by mouth 2 (two) times daily with a meal. 07/31/17   Veryl Speak, MD      Allergies    Penicillins    Review of Systems   Review of Systems  Physical Exam Updated Vital Signs BP (!) 147/77 (BP Location: Right Arm)   Pulse 68   Temp 98 F (36.7 C) (Oral)   Resp 15   Ht '5\' 1"'$  (1.549 m)   Wt 99.8 kg   SpO2 100%   BMI 41.57 kg/m  Physical Exam Vitals and nursing note reviewed.  Constitutional:      Appearance: Normal appearance.  HENT:     Head: Normocephalic and atraumatic.  Eyes:     General: No scleral icterus.    Conjunctiva/sclera: Conjunctivae normal.  Pulmonary:     Effort: Pulmonary effort is normal. No respiratory distress.  Musculoskeletal:     Comments: Tenderness and swelling on the dorsal surface of the foot.  Strong DP pulse.  Normal plantarflexion strength  Skin:    Findings: No rash.  Neurological:     Mental Status: She is alert.  Psychiatric:        Mood and Affect: Mood normal.     ED Results / Procedures / Treatments   Labs (all labs ordered are listed, but only abnormal results are displayed) Labs Reviewed - No data to display  EKG None  Radiology DG Ankle Complete Left  Result Date: 11/05/2022 CLINICAL  DATA:  Normal mediastinum and cardiac silhouette. Normal pulmonary vasculature. No evidence of effusion, infiltrate, or pneumothorax. No acute bony abnormality. EXAM: LEFT ANKLE COMPLETE - 3+ VIEW COMPARISON:  None Available. FINDINGS: Ankle mortise intact. The talar dome is normal. No malleolar fracture. The calcaneus is normal. IMPRESSION: No fracture or dislocation. Electronically Signed   By: Suzy Bouchard M.D.   On: 11/05/2022 13:38    Procedures Procedures   Medications Ordered in ED Medications - No data to display  ED Course/ Medical Decision Making/ A&P                             Medical Decision Making Amount and/or Complexity of Data Reviewed Radiology: ordered.   37 year old female presenting today with left ankle injury.  Says the pain is mostly to the foot.  I ordered, viewed and interpreted her x-ray.  I agree with radiology that it is negative.  It did capture the part where her foot was swollen despite it being an ankle radiograph  She declined Tylenol and ibuprofen.  Will be treated with a cam boot and outpatient follow-up as needed.  She and her significant other are agreeable to this  plan  Final Clinical Impression(s) / ED Diagnoses Final diagnoses:  Sprain of left foot, initial encounter    Rx / DC Orders ED Discharge Orders     None      Results and diagnoses were explained to the patient. Return precautions discussed in full. Patient had no additional questions and expressed complete understanding.   This chart was dictated using voice recognition software.  Despite best efforts to proofread,  errors can occur which can change the documentation meaning.    Darliss Ridgel 11/05/22 1352    Dorie Rank, MD 11/06/22 [phone]

## 2023-07-29 DIAGNOSIS — E6609 Other obesity due to excess calories: Secondary | ICD-10-CM | POA: Diagnosis not present

## 2023-07-29 DIAGNOSIS — Z0000 Encounter for general adult medical examination without abnormal findings: Secondary | ICD-10-CM | POA: Diagnosis not present

## 2023-07-29 DIAGNOSIS — Z13220 Encounter for screening for lipoid disorders: Secondary | ICD-10-CM | POA: Diagnosis not present

## 2023-07-29 DIAGNOSIS — F32A Depression, unspecified: Secondary | ICD-10-CM | POA: Diagnosis not present

## 2023-07-29 DIAGNOSIS — Z124 Encounter for screening for malignant neoplasm of cervix: Secondary | ICD-10-CM | POA: Diagnosis not present

## 2023-07-29 DIAGNOSIS — E282 Polycystic ovarian syndrome: Secondary | ICD-10-CM | POA: Diagnosis not present

## 2023-07-29 DIAGNOSIS — Z1331 Encounter for screening for depression: Secondary | ICD-10-CM | POA: Diagnosis not present

## 2023-07-29 DIAGNOSIS — Z23 Encounter for immunization: Secondary | ICD-10-CM | POA: Diagnosis not present

## 2023-10-14 DIAGNOSIS — E7800 Pure hypercholesterolemia, unspecified: Secondary | ICD-10-CM | POA: Diagnosis not present

## 2023-10-14 DIAGNOSIS — F321 Major depressive disorder, single episode, moderate: Secondary | ICD-10-CM | POA: Diagnosis not present

## 2023-10-14 DIAGNOSIS — E282 Polycystic ovarian syndrome: Secondary | ICD-10-CM | POA: Diagnosis not present
